# Patient Record
Sex: Female | Born: 1965 | Race: Black or African American | Hispanic: No | Marital: Single | Smoking: Never smoker
Health system: Southern US, Community
[De-identification: ages and names within clinical notes are randomized; demographics above are authoritative.]

## PROBLEM LIST (undated history)

## (undated) DIAGNOSIS — R7301 Impaired fasting glucose: Secondary | ICD-10-CM

## (undated) DIAGNOSIS — G4733 Obstructive sleep apnea (adult) (pediatric): Secondary | ICD-10-CM

## (undated) DIAGNOSIS — D569 Thalassemia, unspecified: Secondary | ICD-10-CM

## (undated) DIAGNOSIS — D509 Iron deficiency anemia, unspecified: Secondary | ICD-10-CM

## (undated) DIAGNOSIS — E669 Obesity, unspecified: Secondary | ICD-10-CM

## (undated) DIAGNOSIS — J309 Allergic rhinitis, unspecified: Secondary | ICD-10-CM

## (undated) DIAGNOSIS — K8020 Calculus of gallbladder without cholecystitis without obstruction: Secondary | ICD-10-CM

## (undated) DIAGNOSIS — I10 Essential (primary) hypertension: Secondary | ICD-10-CM

## (undated) DIAGNOSIS — E119 Type 2 diabetes mellitus without complications: Secondary | ICD-10-CM

## (undated) HISTORY — DX: Thalassemia, unspecified: D56.9

## (undated) HISTORY — DX: Calculus of gallbladder without cholecystitis without obstruction: K80.20

## (undated) HISTORY — DX: Allergic rhinitis, unspecified: J30.9

## (undated) HISTORY — DX: Obstructive sleep apnea (adult) (pediatric): G47.33

## (undated) HISTORY — DX: Essential (primary) hypertension: I10

## (undated) HISTORY — DX: Obesity, unspecified: E66.9

## (undated) HISTORY — DX: Type 2 diabetes mellitus without complications: E11.9

## (undated) HISTORY — DX: Impaired fasting glucose: R73.01

## (undated) HISTORY — DX: Iron deficiency anemia, unspecified: D50.9

---

## 1997-08-21 HISTORY — PX: CHOLECYSTECTOMY: SHX55

## 2000-10-25 ENCOUNTER — Encounter: Payer: Self-pay | Admitting: Internal Medicine

## 2000-10-25 ENCOUNTER — Ambulatory Visit (HOSPITAL_COMMUNITY): Admission: RE | Admit: 2000-10-25 | Discharge: 2000-10-25 | Payer: Self-pay | Admitting: Internal Medicine

## 2003-03-05 ENCOUNTER — Other Ambulatory Visit: Admission: RE | Admit: 2003-03-05 | Discharge: 2003-03-05 | Payer: Self-pay | Admitting: *Deleted

## 2004-01-23 ENCOUNTER — Encounter: Admission: RE | Admit: 2004-01-23 | Discharge: 2004-01-23 | Payer: Self-pay | Admitting: Internal Medicine

## 2005-12-28 ENCOUNTER — Encounter: Admission: RE | Admit: 2005-12-28 | Discharge: 2005-12-28 | Payer: Self-pay | Admitting: *Deleted

## 2006-07-26 ENCOUNTER — Emergency Department (HOSPITAL_COMMUNITY): Admission: EM | Admit: 2006-07-26 | Discharge: 2006-07-26 | Payer: Self-pay | Admitting: Emergency Medicine

## 2006-12-30 ENCOUNTER — Encounter: Admission: RE | Admit: 2006-12-30 | Discharge: 2006-12-30 | Payer: Self-pay | Admitting: *Deleted

## 2007-02-14 ENCOUNTER — Ambulatory Visit: Payer: Self-pay | Admitting: Gastroenterology

## 2007-02-28 ENCOUNTER — Ambulatory Visit: Payer: Self-pay | Admitting: Gastroenterology

## 2008-01-30 ENCOUNTER — Encounter: Admission: RE | Admit: 2008-01-30 | Discharge: 2008-01-30 | Payer: Self-pay | Admitting: Obstetrics and Gynecology

## 2008-08-09 ENCOUNTER — Encounter: Admission: RE | Admit: 2008-08-09 | Discharge: 2008-11-07 | Payer: Self-pay | Admitting: Internal Medicine

## 2008-12-03 ENCOUNTER — Encounter: Admission: RE | Admit: 2008-12-03 | Discharge: 2008-12-03 | Payer: Self-pay | Admitting: Internal Medicine

## 2009-02-06 ENCOUNTER — Encounter: Admission: RE | Admit: 2009-02-06 | Discharge: 2009-02-06 | Payer: Self-pay | Admitting: Obstetrics and Gynecology

## 2009-04-01 ENCOUNTER — Encounter: Admission: RE | Admit: 2009-04-01 | Discharge: 2009-06-30 | Payer: Self-pay | Admitting: Internal Medicine

## 2009-09-30 ENCOUNTER — Encounter: Admission: RE | Admit: 2009-09-30 | Discharge: 2009-09-30 | Payer: Self-pay | Admitting: Internal Medicine

## 2010-03-03 ENCOUNTER — Encounter
Admission: RE | Admit: 2010-03-03 | Discharge: 2010-03-03 | Payer: Self-pay | Source: Home / Self Care | Attending: Obstetrics and Gynecology | Admitting: Obstetrics and Gynecology

## 2012-02-15 ENCOUNTER — Encounter: Payer: Self-pay | Admitting: Gastroenterology

## 2012-10-04 ENCOUNTER — Other Ambulatory Visit: Payer: Self-pay

## 2012-10-04 DIAGNOSIS — Z1231 Encounter for screening mammogram for malignant neoplasm of breast: Secondary | ICD-10-CM

## 2012-10-18 ENCOUNTER — Encounter: Payer: Self-pay | Admitting: Gastroenterology

## 2012-10-26 ENCOUNTER — Ambulatory Visit
Admission: RE | Admit: 2012-10-26 | Discharge: 2012-10-26 | Disposition: A | Discharge status: Home / Self Care | Payer: PRIVATE HEALTH INSURANCE | Source: Ambulatory Visit

## 2012-10-26 DIAGNOSIS — Z1231 Encounter for screening mammogram for malignant neoplasm of breast: Secondary | ICD-10-CM

## 2013-11-02 ENCOUNTER — Other Ambulatory Visit: Payer: Self-pay

## 2013-11-02 DIAGNOSIS — Z1231 Encounter for screening mammogram for malignant neoplasm of breast: Secondary | ICD-10-CM

## 2013-11-13 ENCOUNTER — Ambulatory Visit: Payer: PRIVATE HEALTH INSURANCE

## 2013-12-05 ENCOUNTER — Encounter (INDEPENDENT_AMBULATORY_CARE_PROVIDER_SITE_OTHER): Payer: Self-pay

## 2013-12-05 ENCOUNTER — Ambulatory Visit
Admission: RE | Admit: 2013-12-05 | Discharge: 2013-12-05 | Disposition: A | Discharge status: Home / Self Care | Payer: 59 | Source: Ambulatory Visit

## 2013-12-05 DIAGNOSIS — Z1231 Encounter for screening mammogram for malignant neoplasm of breast: Secondary | ICD-10-CM

## 2014-12-20 ENCOUNTER — Other Ambulatory Visit: Payer: Self-pay

## 2014-12-20 DIAGNOSIS — Z1231 Encounter for screening mammogram for malignant neoplasm of breast: Secondary | ICD-10-CM

## 2014-12-31 ENCOUNTER — Ambulatory Visit
Admission: RE | Admit: 2014-12-31 | Discharge: 2014-12-31 | Disposition: A | Discharge status: Home / Self Care | Payer: 59 | Source: Ambulatory Visit

## 2014-12-31 DIAGNOSIS — Z1231 Encounter for screening mammogram for malignant neoplasm of breast: Secondary | ICD-10-CM

## 2015-12-19 ENCOUNTER — Other Ambulatory Visit (HOSPITAL_COMMUNITY): Payer: Self-pay | Admitting: Orthopedic Surgery

## 2015-12-19 ENCOUNTER — Ambulatory Visit (HOSPITAL_COMMUNITY): Payer: 59

## 2015-12-19 DIAGNOSIS — M7989 Other specified soft tissue disorders: Principal | ICD-10-CM

## 2015-12-19 DIAGNOSIS — M79661 Pain in right lower leg: Secondary | ICD-10-CM

## 2016-09-11 LAB — IFOBT (OCCULT BLOOD): IMMUNOLOGICAL FECAL OCCULT BLOOD TEST: POSITIVE

## 2016-09-14 ENCOUNTER — Encounter: Payer: Self-pay | Admitting: Gastroenterology

## 2016-10-26 ENCOUNTER — Other Ambulatory Visit: Payer: 59

## 2016-10-26 ENCOUNTER — Encounter: Payer: Self-pay | Admitting: Gastroenterology

## 2016-10-26 ENCOUNTER — Other Ambulatory Visit (INDEPENDENT_AMBULATORY_CARE_PROVIDER_SITE_OTHER): Payer: 59

## 2016-10-26 ENCOUNTER — Ambulatory Visit (INDEPENDENT_AMBULATORY_CARE_PROVIDER_SITE_OTHER): Payer: 59 | Admitting: Gastroenterology

## 2016-10-26 ENCOUNTER — Encounter (INDEPENDENT_AMBULATORY_CARE_PROVIDER_SITE_OTHER): Payer: Self-pay

## 2016-10-26 VITALS — BP 130/70 | HR 84 | Ht 63.0 in | Wt 394.0 lb

## 2016-10-26 DIAGNOSIS — G4733 Obstructive sleep apnea (adult) (pediatric): Secondary | ICD-10-CM

## 2016-10-26 DIAGNOSIS — Z800 Family history of malignant neoplasm of digestive organs: Secondary | ICD-10-CM | POA: Diagnosis not present

## 2016-10-26 DIAGNOSIS — R195 Other fecal abnormalities: Secondary | ICD-10-CM | POA: Diagnosis not present

## 2016-10-26 DIAGNOSIS — R718 Other abnormality of red blood cells: Secondary | ICD-10-CM

## 2016-10-26 DIAGNOSIS — D509 Iron deficiency anemia, unspecified: Secondary | ICD-10-CM | POA: Diagnosis not present

## 2016-10-26 LAB — CBC WITH DIFFERENTIAL/PLATELET
BASOS PCT: 0.5 % (ref 0.0–3.0)
Basophils Absolute: 0 10*3/uL (ref 0.0–0.1)
EOS ABS: 0.4 10*3/uL (ref 0.0–0.7)
Eosinophils Relative: 4.7 % (ref 0.0–5.0)
HCT: 32.1 % — ABNORMAL LOW (ref 36.0–46.0)
Hemoglobin: 9.9 g/dL — ABNORMAL LOW (ref 12.0–15.0)
LYMPHS ABS: 1.4 10*3/uL (ref 0.7–4.0)
Lymphocytes Relative: 15.2 % (ref 12.0–46.0)
MCHC: 30.9 g/dL (ref 30.0–36.0)
MCV: 59.3 fl — ABNORMAL LOW (ref 78.0–100.0)
MONO ABS: 0.4 10*3/uL (ref 0.1–1.0)
Monocytes Relative: 4.8 % (ref 3.0–12.0)
NEUTROS ABS: 6.8 10*3/uL (ref 1.4–7.7)
Neutrophils Relative %: 74.8 % (ref 43.0–77.0)
PLATELETS: 341 10*3/uL (ref 150.0–400.0)
RBC: 5.42 Mil/uL — ABNORMAL HIGH (ref 3.87–5.11)
RDW: 24.3 % — AB (ref 11.5–15.5)
WBC: 9 10*3/uL (ref 4.0–10.5)

## 2016-10-26 MED ORDER — NA SULFATE-K SULFATE-MG SULF 17.5-3.13-1.6 GM/177ML PO SOLN: 1.0000 | Freq: Once | ORAL | 0 refills | Status: AC

## 2016-10-26 NOTE — Progress Notes (Signed)
[facility] Consult Note:  History: Kerry Lopez 10/26/2016  Referring physician: Rodrigo Ran, MD  Reason for consult/chief complaint: Colon Cancer Screening (positive hemocult at Dr Perini's office, patient has noticed some spotting first week of July and lasted 3-4 days)   Subjective  HPI:  This is a 51 year old woman referred by primary care noted above for heme positive stool and microcytic anemia. Leyda last had a routine colonoscopy in December 2008. Her sister was diagnosed with colorectal cancer in her late 85s, died in her early to mid 31s. Patient was found to have heme positive stool a recent routine office visit, and labs reveal a microcytic anemia with a hemoglobin of 9.5 and an MCV of 55. She is known to have thalassemia, and says this has been a diagnosis for many years. I'm afraid I do not have any prior CBC for comparison. Iron studies had a saturation of 10%, and she admits that Dr.perini recommended an iron supplement but she did not take it more than a few days because she was feeling well. She has seen occasional blood staining on her underpants but no frank rectal bleeding. Her bowel habits are regular without abdominal pain, nausea, vomiting, early satiety, dysphagia, or unanticipated weight loss.   ROS:  Review of Systems  Constitutional: Negative for appetite change and unexpected weight change.  HENT: Negative for mouth sores and voice change.   Eyes: Negative for pain and redness.  Respiratory: Negative for cough and shortness of breath.   Cardiovascular: Negative for chest pain and palpitations.  Genitourinary: Negative for dysuria and hematuria.  Musculoskeletal: Negative for arthralgias and myalgias.  Skin: Negative for pallor and rash.  Neurological: Negative for weakness and headaches.  Hematological: Negative for adenopathy.     Past Medical History: Past Medical History:  Diagnosis Date  . Allergic rhinitis   . Diabetes  (HCC)   . Gallstones   . Hypertension   . IDA (iron deficiency anemia)   . IFG (impaired fasting glucose)   . Obesity   . Sleep apnea, obstructive   . Thalassemia    S-alpha thalassemia     Past Surgical History: Past Surgical History:  Procedure Laterality Date  . CHOLECYSTECTOMY  08/1997     Family History: Family History  Problem Relation Age of Onset  . Diabetes Mother   . Hypertension Mother   . Sickle cell trait Mother   . Colon polyps Mother   . Lung cancer Father        smoker  . Hypertension Sister   . Breast cancer Sister   . Colon cancer Sister 51  . Hypertension Sister   . Hypertension Sister   . Colon cancer Maternal Uncle   . Colon cancer Other        mat great grandmother  . Breast cancer Cousin        mat side    Social History: Social History   Social History  . Marital status: Single    Spouse name: N/A  . Number of children: 0  . Years of education: N/A   Occupational History  . operations accounts lead    Social History Main Topics  . Smoking status: Never Smoker  . Smokeless tobacco: Never Used  . Alcohol use No  . Drug use: No  . Sexual activity: Not Asked   Other Topics Concern  . None   Social History Narrative  . None    Allergies: No Known Allergies  Outpatient Meds:  Current Outpatient Prescriptions  Medication Sig Dispense Refill  . carvedilol (COREG) 12.5 MG tablet Take 12.5 mg by mouth 2 (two) times daily with a meal.    . hydrochlorothiazide (HYDRODIURIL) 25 MG tablet Take 25 mg by mouth daily.    Marland Kitchen losartan (COZAAR) 100 MG tablet Take 100 mg by mouth daily.    . Na Sulfate-K Sulfate-Mg Sulf 17.5-3.13-1.6 GM/180ML SOLN Take 1 kit by mouth once. 354 mL 0   No current facility-administered medications for this visit.       ___________________________________________________________________ Objective   Exam:  BP 130/70   Pulse 84   Ht '5\' 3"'$  (1.6 m)   Wt (!) 394 lb (178.7 kg)   BMI 69.79 kg/m     General: this is a(n) Well-appearing morbidly obese woman   Eyes: sclera anicteric, no redness  ENT: oral mucosa moist without lesions, no cervical or supraclavicular lymphadenopathy, good dentition  CV: RRR without murmur, S1/S2, no JVD, no peripheral edema  Resp: clear to auscultation bilaterally, normal RR and effort noted  GI: soft, no tenderness, with active bowel sounds. No guarding or palpable organomegaly noted, limited by BMI  Skin; warm and dry, no rash or jaundice noted  Neuro: awake, alert and oriented x 3. Normal gross motor function and fluent speech  Labs as noted above in the history, recent primary care noted and labs were reviewed.  Assessment: Encounter Diagnoses  Name Primary?  . Heme positive stool Yes  . Iron deficiency anemia, unspecified iron deficiency anemia type   . Family hx of colon cancer   . Morbid obesity ([place])   . Obstructive sleep apnea syndrome     Heme positive stool unclear relation to this microcytic anemia. She is known to have thalassemia, but a baseline hemoglobin is uncertain at this point. We discussed how she could have occult GI blood loss from upper or lower GI tract. As such, I feel she needs both EGD and colonoscopy. There is increased risk due to her BMI and sleep apnea , which requires that we perform the procedures in the hospital endoscopy lab.  Plan:  EGD and colonoscopy with anesthesia support Patient at increased risk for cardiopulmonary complications of procedure due to medical comorbidities.  CBC today  She was strongly encouraged to start oral iron therapy as recommended by primary care.  Thank you for the courtesy of this consult.  Please call me with any questions or concerns.  Nelida Meuse III  CC: Crist Infante, MD

## 2016-10-26 NOTE — Patient Instructions (Signed)
If you are age 51 or older, your body mass index should be between 23-30. Your Body mass index is 69.79 kg/m. If this is out of the aforementioned range listed, please consider follow up with your Primary Care Provider.  If you are age 51 or younger, your body mass index should be between 19-25. Your Body mass index is 69.79 kg/m. If this is out of the aformentioned range listed, please consider follow up with your Primary Care Provider.   You have been scheduled for an endoscopy and colonoscopy. Please follow the written instructions given to you at your visit today. Please pick up your prep supplies at the pharmacy within the next 1-3 days. If you use inhalers (even only as needed), please bring them with you on the day of your procedure. Your physician has requested that you go to www.startemmi.com and enter the access code given to you at your visit today. This web site gives a general overview about your procedure. However, you should still follow specific instructions given to you by our office regarding your preparation for the procedure.  Your physician has requested that you go to the basement for the following lab work before leaving today: CBC   Thank you for choosing [facility]   Dr Amada JupiterHenry Danis III

## 2016-10-27 LAB — DIFFERENTIAL
BASOS PCT: 0 %
Basophils Absolute: 0 cells/uL (ref 0–200)
Eosinophils Absolute: 360 cells/uL (ref 15–500)
Eosinophils Relative: 4 %
LYMPHS PCT: 16 %
Lymphs Abs: 1440 cells/uL (ref 850–3900)
MONOS PCT: 4 %
Monocytes Absolute: 360 cells/uL (ref 200–950)
NEUTROS ABS: 6840 {cells}/uL (ref 1500–7800)
Neutrophils Relative %: 76 %

## 2016-11-12 ENCOUNTER — Telehealth: Payer: Self-pay

## 2016-11-12 NOTE — Telephone Encounter (Signed)
Sherrilyn Rist, MD  Alexis Frock, CMA        Sheralyn Boatman,   Would you please see if this patient is willing to move up her EGD/colon from Fri, Oct 5th to Monday, Sept 10th?    Left message to return call.

## 2016-11-13 NOTE — Telephone Encounter (Signed)
Patient says that she does want to have her procedure moved up to Sept 10th. She is requesting a callback anytime after between 1:15pm-2pm. Best # [phone]

## 2016-11-13 NOTE — Telephone Encounter (Addendum)
Procedure has been moved from 12-25-2016 to 11-30-2016 at 815am. New instructions have been mailed to pts home address.

## 2016-11-17 ENCOUNTER — Encounter (HOSPITAL_COMMUNITY): Payer: Self-pay

## 2016-11-30 ENCOUNTER — Encounter (HOSPITAL_COMMUNITY): Payer: Self-pay

## 2016-11-30 ENCOUNTER — Ambulatory Visit (HOSPITAL_COMMUNITY)
Admission: RE | Admit: 2016-11-30 | Discharge: 2016-11-30 | Disposition: A | Discharge status: Home / Self Care | Payer: 59 | Source: Ambulatory Visit | Attending: Gastroenterology | Admitting: Gastroenterology

## 2016-11-30 ENCOUNTER — Ambulatory Visit (HOSPITAL_COMMUNITY): Payer: 59 | Admitting: Certified Registered Nurse Anesthetist

## 2016-11-30 ENCOUNTER — Encounter (HOSPITAL_COMMUNITY)
Admission: RE | Disposition: A | Discharge status: Home / Self Care | Payer: Self-pay | Source: Ambulatory Visit | Attending: Gastroenterology

## 2016-11-30 DIAGNOSIS — G4733 Obstructive sleep apnea (adult) (pediatric): Secondary | ICD-10-CM | POA: Diagnosis not present

## 2016-11-30 DIAGNOSIS — K921 Melena: Secondary | ICD-10-CM | POA: Diagnosis not present

## 2016-11-30 DIAGNOSIS — K449 Diaphragmatic hernia without obstruction or gangrene: Secondary | ICD-10-CM | POA: Insufficient documentation

## 2016-11-30 DIAGNOSIS — K3189 Other diseases of stomach and duodenum: Secondary | ICD-10-CM | POA: Insufficient documentation

## 2016-11-30 DIAGNOSIS — R195 Other fecal abnormalities: Secondary | ICD-10-CM | POA: Diagnosis not present

## 2016-11-30 DIAGNOSIS — Z800 Family history of malignant neoplasm of digestive organs: Secondary | ICD-10-CM | POA: Diagnosis not present

## 2016-11-30 DIAGNOSIS — D509 Iron deficiency anemia, unspecified: Secondary | ICD-10-CM | POA: Diagnosis not present

## 2016-11-30 DIAGNOSIS — E119 Type 2 diabetes mellitus without complications: Secondary | ICD-10-CM | POA: Insufficient documentation

## 2016-11-30 DIAGNOSIS — I10 Essential (primary) hypertension: Secondary | ICD-10-CM | POA: Insufficient documentation

## 2016-11-30 HISTORY — PX: ESOPHAGOGASTRODUODENOSCOPY (EGD) WITH PROPOFOL: SHX5813

## 2016-11-30 HISTORY — PX: COLONOSCOPY WITH PROPOFOL: SHX5780

## 2016-11-30 SURGERY — ESOPHAGOGASTRODUODENOSCOPY (EGD) WITH PROPOFOL
Anesthesia: Monitor Anesthesia Care

## 2016-11-30 MED ORDER — PROPOFOL 500 MG/50ML IV EMUL
INTRAVENOUS | Status: DC | PRN
  Administered 2016-11-30: 140 ug/kg/min via INTRAVENOUS

## 2016-11-30 MED ORDER — LIDOCAINE 2% (20 MG/ML) 5 ML SYRINGE
INTRAMUSCULAR | Status: DC | PRN
  Administered 2016-11-30: 100 mg via INTRAVENOUS

## 2016-11-30 MED ORDER — LACTATED RINGERS IV SOLN
INTRAVENOUS | Status: DC
Start: 2016-11-30 — End: 2016-11-30
  Administered 2016-11-30: 08:00:00 via INTRAVENOUS

## 2016-11-30 MED ORDER — LIDOCAINE 2% (20 MG/ML) 5 ML SYRINGE
INTRAMUSCULAR | Status: AC
  Filled 2016-11-30: qty 5

## 2016-11-30 MED ORDER — PROPOFOL 10 MG/ML IV BOLUS
INTRAVENOUS | Status: AC
  Filled 2016-11-30: qty 40

## 2016-11-30 MED ORDER — PROPOFOL 10 MG/ML IV BOLUS
INTRAVENOUS | Status: AC
  Filled 2016-11-30: qty 20

## 2016-11-30 MED ORDER — SODIUM CHLORIDE 0.9 % IV SOLN: INTRAVENOUS | Status: DC

## 2016-11-30 SURGICAL SUPPLY — 25 items

## 2016-11-30 NOTE — H&P (Signed)
History:  This patient presents for endoscopic testing for heme pos stool and iron deficiency anemia.  Charolotte CapuchinFaith C Chapel Referring physician: Rodrigo RanPerini, Mark, MD  Past Medical History: Past Medical History:  Diagnosis Date  . Allergic rhinitis   . Diabetes (HCC)    managing with diet  . Gallstones   . Hypertension   . IDA (iron deficiency anemia)   . IFG (impaired fasting glucose)   . Obesity   . Sleep apnea, obstructive   . Thalassemia    S-alpha thalassemia     Past Surgical History: Past Surgical History:  Procedure Laterality Date  . CHOLECYSTECTOMY  08/1997    Allergies: No Known Allergies  Outpatient Meds: Current Facility-Administered Medications  Medication Dose Route Frequency Provider Last Rate Last Dose  . 0.9 %  sodium chloride infusion   Intravenous Continuous Danis, Starr LakeHenry L III, MD      . lactated ringers infusion   Intravenous Continuous Danis, Starr LakeHenry L III, MD          ___________________________________________________________________ Objective   Exam:  BP (!) 189/68   Pulse 78   Temp 98.7 F (37.1 C) (Oral)   Resp 17   Ht 5\' 3"  (1.6 m)   Wt (!) 394 lb (178.7 kg)   SpO2 97%   BMI 69.79 kg/m    CV: RRR without murmur, S1/S2, no JVD, no peripheral edema  Resp: clear to auscultation bilaterally, normal RR and effort noted  WU:[id number]:morbidly obese, soft, no tenderness, with active bowel sounds. No guarding or palpable organomegaly noted.  Neuro: awake, alert and oriented x 3. Normal gross motor function and fluent speech   Assessment:  IDA, heme pos  Plan:  EGD/colonoscopy   Charlie PitterHenry L Danis III

## 2016-11-30 NOTE — Interval H&P Note (Signed)
History and Physical Interval Note:  11/30/2016 8:21 AM  Kerry Lopez  has presented today for surgery, with the diagnosis of Heme+ stool, IDA tt  The various methods of treatment have been discussed with the patient and family. After consideration of risks, benefits and other options for treatment, the patient has consented to  Procedure(s): ESOPHAGOGASTRODUODENOSCOPY (EGD) WITH PROPOFOL (N/A) COLONOSCOPY WITH PROPOFOL (N/A) as a surgical intervention .  The patient's history has been reviewed, patient examined, no change in status, stable for surgery.  I have reviewed the patient's chart and labs.  Questions were answered to the patient's satisfaction.     Kerry Lopez

## 2016-11-30 NOTE — Op Note (Signed)
[facility] Patient Name: Kerry Lopez Procedure Date: 11/30/2016 MRN: [id number] Attending MD: Estill Cotta. Loletha Carrow , MD Date of Birth: 1965-08-20 CSN: [id number] Age: 51 Admit Type: Outpatient Procedure:                Upper GI endoscopy Indications:              Iron deficiency anemia, Heme positive stool Providers:                Mallie Mussel L. Loletha Carrow, MD, Angus Seller, William Dalton, Technician Referring MD:             Crist Infante, MD Medicines:                Monitored Anesthesia Care (BMI 69) Complications:            No immediate complications. Estimated Blood Loss:     Estimated blood loss: none. Procedure:                Pre-Anesthesia Assessment:                           - Prior to the procedure, a History and Physical                            was performed, and patient medications and                            allergies were reviewed. The patient's tolerance of                            previous anesthesia was also reviewed. The risks                            and benefits of the procedure and the sedation                            options and risks were discussed with the patient.                            All questions were answered, and informed consent                            was obtained. Prior Anticoagulants: The patient has                            taken no previous anticoagulant or antiplatelet                            agents. ASA Grade Assessment: III - A patient with                            severe systemic disease. After reviewing the risks  and benefits, the patient was deemed in                            satisfactory condition to undergo the procedure.                           After obtaining informed consent, the endoscope was                            passed under direct vision. Throughout the                            procedure, the patient's blood pressure, pulse, and                         oxygen saturations were monitored continuously. The                            EG-[id number] ([id number]) scope was introduced through the                            mouth, and advanced to the second part of duodenum.                            The upper GI endoscopy was accomplished without                            difficulty. The patient tolerated the procedure. Scope In: Scope Out: Findings:      A medium-sized hiatal hernia was present.      The stomach was normal.      The cardia and gastric fundus were normal on retroflexion.      A single 10 mm submucosal nodule was found in the second portion of the       duodenum, visibile but past the reach of the endoscope. Impression:               - Medium-sized hiatal hernia.                           - Normal stomach.                           - Submucosal nodule found in the duodenum.                           - No specimens collected. Moderate Sedation:      MAC sedation used Recommendation:           - Patient has a contact number available for                            emergencies. The signs and symptoms of potential                            delayed complications were discussed with the  patient. Return to normal activities tomorrow.                            Written discharge instructions were provided to the                            patient.                           - Resume previous diet.                           - Continue present medications.                           - See the other procedure note for documentation of                            additional recommendations.                           - Will investigate feasibility of EUS for duodenal                            finding. Procedure Code(s):        --- Professional ---                           [phone], Esophagogastroduodenoscopy, flexible,                            transoral; diagnostic, including collection of                             specimen(s) by brushing or washing, when performed                            (separate procedure) Diagnosis Code(s):        --- Professional ---                           K44.9, Diaphragmatic hernia without obstruction or                            gangrene                           K31.89, Other diseases of stomach and duodenum                           D50.9, Iron deficiency anemia, unspecified                           R19.5, Other fecal abnormalities CPT copyright 2016 [employer]. All rights reserved. The codes documented in this report are preliminary and upon coder review may  be revised to meet current compliance requirements. Kayden Hutmacher L. Loletha Carrow, MD 11/30/2016 9:12:11 AM This report has been signed electronically.  Number of Addenda: 0 

## 2016-11-30 NOTE — Discharge Instructions (Signed)
YOU HAD AN ENDOSCOPIC PROCEDURE TODAY: Refer to the procedure report and other information in the discharge instructions given to you for any specific questions about what was found during the examination. If this information does not answer your questions, please call [place] office at [phone] to clarify.  ° °YOU SHOULD EXPECT: Some feelings of bloating in the abdomen. Passage of more gas than usual. Walking can help get rid of the air that was put into your GI tract during the procedure and reduce the bloating. If you had a lower endoscopy (such as a colonoscopy or flexible sigmoidoscopy) you may notice spotting of blood in your stool or on the toilet paper. Some abdominal soreness may be present for a day or two, also. ° °DIET: Your first meal following the procedure should be a light meal and then it is ok to progress to your normal diet. A half-sandwich or bowl of soup is an example of a good first meal. Heavy or fried foods are harder to digest and may make you feel nauseous or bloated. Drink plenty of fluids but you should avoid alcoholic beverages for 24 hours. If you had a esophageal dilation, please see attached instructions for diet.   ° °ACTIVITY: Your care partner should take you home directly after the procedure. You should plan to take it easy, moving slowly for the rest of the day. You can resume normal activity the day after the procedure however YOU SHOULD NOT DRIVE, use power tools, machinery or perform tasks that involve climbing or major physical exertion for 24 hours (because of the sedation medicines used during the test).  ° °SYMPTOMS TO REPORT IMMEDIATELY: °A gastroenterologist can be reached at any hour. Please call [phone]  for any of the following symptoms:  °Following lower endoscopy (colonoscopy, flexible sigmoidoscopy) °Excessive amounts of blood in the stool  °Significant tenderness, worsening of abdominal pains  °Swelling of the abdomen that is new, acute  °Fever of 100° or  higher  °Following upper endoscopy (EGD, EUS, ERCP, esophageal dilation) °Vomiting of blood or coffee ground material  °New, significant abdominal pain  °New, significant chest pain or pain under the shoulder blades  °Painful or persistently difficult swallowing  °New shortness of breath  °Black, tarry-looking or red, bloody stools ° °FOLLOW UP:  °If any biopsies were taken you will be contacted by phone or by letter within the next 1-3 weeks. Call [phone]  if you have not heard about the biopsies in 3 weeks.  °Please also call with any specific questions about appointments or follow up tests. ° °

## 2016-11-30 NOTE — Anesthesia Postprocedure Evaluation (Signed)
Anesthesia Post Note  Patient: Kerry Lopez  Procedure(s) Performed: Procedure(s) (LRB): ESOPHAGOGASTRODUODENOSCOPY (EGD) WITH PROPOFOL (N/A) COLONOSCOPY WITH PROPOFOL (N/A)     Patient location during evaluation: PACU Anesthesia Type: MAC Level of consciousness: awake and alert Pain management: pain level controlled Vital Signs Assessment: post-procedure vital signs reviewed and stable Respiratory status: spontaneous breathing, nonlabored ventilation, respiratory function stable and patient connected to nasal cannula oxygen Cardiovascular status: stable and blood pressure returned to baseline Anesthetic complications: no    Last Vitals:  Vitals:   11/30/16 0920 11/30/16 0930  BP: (!) 119/57 138/61  Pulse: 85 81  Resp: 19 20  Temp:    SpO2: 100% 99%    Last Pain:  Vitals:   11/30/16 0910  TempSrc: Oral                 Ryan P Ellender

## 2016-11-30 NOTE — Anesthesia Preprocedure Evaluation (Addendum)
Anesthesia Evaluation  Patient identified by MRN, date of birth, ID band Patient awake    Reviewed: Allergy & Precautions, NPO status , Patient's Chart, lab work & pertinent test results  Airway Mallampati: II  TM Distance: >3 FB Neck ROM: Full    Dental no notable dental hx.    Pulmonary sleep apnea ,    Pulmonary exam normal breath sounds clear to auscultation       Cardiovascular hypertension, Pt. on medications and Pt. on home beta blockers Normal cardiovascular exam Rhythm:Regular Rate:Normal     Neuro/Psych negative neurological ROS  negative psych ROS   GI/Hepatic negative GI ROS, Neg liver ROS,   Endo/Other  diabetesMorbid obesity  Renal/GU negative Renal ROS     Musculoskeletal negative musculoskeletal ROS (+)   Abdominal (+) + obese,   Peds  Hematology  (+) anemia ,   Anesthesia Other Findings Super obese  Reproductive/Obstetrics                           Anesthesia Physical Anesthesia Plan  ASA: IV  Anesthesia Plan: MAC   Post-op Pain Management:    Induction: Intravenous  PONV Risk Score and Plan: 2 and Propofol infusion and Treatment may vary due to age or medical condition  Airway Management Planned: Natural Airway  Additional Equipment:   Intra-op Plan:   Post-operative Plan:   Informed Consent: I have reviewed the patients History and Physical, chart, labs and discussed the procedure including the risks, benefits and alternatives for the proposed anesthesia with the patient or authorized representative who has indicated his/her understanding and acceptance.   Dental advisory given  Plan Discussed with: CRNA  Anesthesia Plan Comments:         Anesthesia Quick Evaluation

## 2016-11-30 NOTE — Op Note (Signed)
[facility] Patient Name: Kerry Lopez Procedure Date: 11/30/2016 MRN: [id number] Attending MD: Estill Cotta. Loletha Carrow , MD Date of Birth: June 06, 1965 CSN: [id number] Age: 51 Admit Type: Outpatient Procedure:                Colonoscopy Indications:              Heme positive stool, Iron deficiency anemia Providers:                Mallie Mussel L. Loletha Carrow, MD, Angus Seller, William Dalton, Technician Referring MD:             Crist Infante, MD Medicines:                Monitored Anesthesia Care Complications:            No immediate complications. Estimated Blood Loss:     Estimated blood loss: none. Procedure:                Pre-Anesthesia Assessment:                           - Prior to the procedure, a History and Physical                            was performed, and patient medications and                            allergies were reviewed. The patient's tolerance of                            previous anesthesia was also reviewed. The risks                            and benefits of the procedure and the sedation                            options and risks were discussed with the patient.                            All questions were answered, and informed consent                            was obtained. Prior Anticoagulants: The patient has                            taken no previous anticoagulant or antiplatelet                            agents. ASA Grade Assessment: III - A patient with                            severe systemic disease. After reviewing the risks  and benefits, the patient was deemed in                            satisfactory condition to undergo the procedure.                           - Prior to the procedure, a History and Physical                            was performed, and patient medications and                            allergies were reviewed. The patient's tolerance of      previous anesthesia was also reviewed. The risks                            and benefits of the procedure and the sedation                            options and risks were discussed with the patient.                            All questions were answered, and informed consent                            was obtained. Prior Anticoagulants: The patient has                            taken no previous anticoagulant or antiplatelet                            agents. ASA Grade Assessment: III - A patient with                            severe systemic disease. After reviewing the risks                            and benefits, the patient was deemed in                            satisfactory condition to undergo the procedure.                           After obtaining informed consent, the colonoscope                            was passed under direct vision. Throughout the                            procedure, the patient's blood pressure, pulse, and                            oxygen saturations were monitored continuously. The  EC-[id number] ([id number]) scope was introduced through                            the anus and advanced to the the cecum, identified                            by appendiceal orifice and ileocecal valve. The                            colonoscopy was performed without difficulty. The                            patient tolerated the procedure. The quality of the                            bowel preparation was evaluated using the BBPS                            [facility] Bowel Preparation Scale) with scores of:                            Right Colon = 2, Transverse Colon = 2 and Left                            Colon = 2. The total BBPS score equals 6. The bowel                            preparation used was SUPREP. The ileocecal valve,                            appendiceal orifice, and rectum were photographed. Scope In: 8:49:59 AM Scope Out:  9:02:59 AM Scope Withdrawal Time: 0 hours 7 minutes 25 seconds  Total Procedure Duration: 0 hours 13 minutes 0 seconds  Findings:      The perianal and digital rectal examinations were normal.      The entire examined colon appeared normal on direct and retroflexion       views. Impression:               - The entire examined colon is normal on direct and                            retroflexion views.                           - No specimens collected.                           No source of heme positive stool or anemia on                            either EGD or colonoscopy. The patient's microcytic                            anemia  is at least partially due to reported                            thalasemia. Moderate Sedation:      MAC sedation used Recommendation:           - Patient has a contact number available for                            emergencies. The signs and symptoms of potential                            delayed complications were discussed with the                            patient. Return to normal activities tomorrow.                            Written discharge instructions were provided to the                            patient.                           - Resume previous diet.                           - Continue present medications.                           - Repeat colonoscopy in 5 years for screening                            purposes due to family history of colon cancer in                            sister (Dx in 68's).                           - Return to referring physician to mange iron                            replacement and anemia. Procedure Code(s):        --- Professional ---                           [phone], Colonoscopy, flexible; diagnostic, including                            collection of specimen(s) by brushing or washing,                            when performed (separate procedure) Diagnosis Code(s):        --- Professional ---                            R19.5, Other  fecal abnormalities                           D50.9, Iron deficiency anemia, unspecified CPT copyright 2016 American Medical Association. All rights reserved. The codes documented in this report are preliminary and upon coder review may  be revised to meet current compliance requirements. Henry L. Loletha Carrow, MD 11/30/2016 9:16:19 AM This report has been signed electronically. Number of Addenda: 0

## 2016-11-30 NOTE — Transfer of Care (Signed)
Immediate Anesthesia Transfer of Care Note  Patient: Kerry Lopez  Procedure(s) Performed: Procedure(s): ESOPHAGOGASTRODUODENOSCOPY (EGD) WITH PROPOFOL (N/A) COLONOSCOPY WITH PROPOFOL (N/A)  Patient Location: PACU and Endoscopy Unit  Anesthesia Type:MAC  Level of Consciousness: awake, alert , oriented and patient cooperative  Airway & Oxygen Therapy: Patient Spontanous Breathing and Patient connected to face mask oxygen  Post-op Assessment: Report given to RN, Post -op Vital signs reviewed and stable and Patient moving all extremities  Post vital signs: Reviewed and stable  Last Vitals:  Vitals:   11/30/16 0655  BP: (!) 189/68  Pulse: 78  Resp: 17  Temp: 37.1 C  SpO2: 97%    Last Pain:  Vitals:   11/30/16 0655  TempSrc: Oral         Complications: No apparent anesthesia complications

## 2016-12-01 ENCOUNTER — Encounter (HOSPITAL_COMMUNITY): Payer: Self-pay | Admitting: Gastroenterology

## 2017-05-26 ENCOUNTER — Encounter (HOSPITAL_COMMUNITY): Payer: 59

## 2020-07-24 ENCOUNTER — Other Ambulatory Visit (HOSPITAL_COMMUNITY): Payer: Self-pay

## 2020-07-25 ENCOUNTER — Encounter (HOSPITAL_COMMUNITY)
Admission: RE | Admit: 2020-07-25 | Discharge: 2020-07-25 | Disposition: A | Discharge status: Home / Self Care | Payer: 59 | Source: Ambulatory Visit | Attending: Internal Medicine | Admitting: Internal Medicine

## 2020-07-25 ENCOUNTER — Other Ambulatory Visit: Payer: Self-pay

## 2020-07-25 DIAGNOSIS — D649 Anemia, unspecified: Secondary | ICD-10-CM | POA: Insufficient documentation

## 2020-07-25 MED ORDER — FERUMOXYTOL INJECTION 510 MG/17 ML
510.0000 mg | INTRAVENOUS | Status: DC
  Administered 2020-07-25: 510 mg via INTRAVENOUS
  Filled 2020-07-25: qty 17

## 2020-07-25 NOTE — Discharge Instructions (Signed)
Ferumoxytol injection What is this medicine? FERUMOXYTOL is an iron complex. Iron is used to make healthy red blood cells, which carry oxygen and nutrients throughout the body. This medicine is used to treat iron deficiency anemia. This medicine may be used for other purposes; ask your health care provider or pharmacist if you have questions. COMMON BRAND NAME(S): Feraheme What should I tell my health care provider before I take this medicine? They need to know if you have any of these conditions:  anemia not caused by low iron levels  high levels of iron in the blood  magnetic resonance imaging (MRI) test scheduled  an unusual or allergic reaction to iron, other medicines, foods, dyes, or preservatives  pregnant or trying to get pregnant  breast-feeding How should I use this medicine? This medicine is for injection into a vein. It is given by a health care professional in a hospital or clinic setting. Talk to your pediatrician regarding the use of this medicine in children. Special care may be needed. Overdosage: If you think you have taken too much of this medicine contact a poison control center or emergency room at once. NOTE: This medicine is only for you. Do not share this medicine with others. What if I miss a dose? It is important not to miss your dose. Call your doctor or health care professional if you are unable to keep an appointment. What may interact with this medicine? This medicine may interact with the following medications:  other iron products This list may not describe all possible interactions. Give your health care provider a list of all the medicines, herbs, non-prescription drugs, or dietary supplements you use. Also tell them if you smoke, drink alcohol, or use illegal drugs. Some items may interact with your medicine. What should I watch for while using this medicine? Visit your doctor or healthcare professional regularly. Tell your doctor or healthcare  professional if your symptoms do not start to get better or if they get worse. You may need blood work done while you are taking this medicine. You may need to follow a special diet. Talk to your doctor. Foods that contain iron include: whole grains/cereals, dried fruits, beans, or peas, leafy green vegetables, and organ meats (liver, kidney). What side effects may I notice from receiving this medicine? Side effects that you should report to your doctor or health care professional as soon as possible:  allergic reactions like skin rash, itching or hives, swelling of the face, lips, or tongue  breathing problems  changes in blood pressure  feeling faint or lightheaded, falls  fever or chills  flushing, sweating, or hot feelings  swelling of the ankles or feet Side effects that usually do not require medical attention (report to your doctor or health care professional if they continue or are bothersome):  diarrhea  headache  nausea, vomiting  stomach pain This list may not describe all possible side effects. Call your doctor for medical advice about side effects. You may report side effects to FDA at 1-800-FDA-1088. Where should I keep my medicine? This drug is given in a hospital or clinic and will not be stored at home. NOTE: This sheet is a summary. It may not cover all possible information. If you have questions about this medicine, talk to your doctor, pharmacist, or health care provider.  2021 Elsevier/Gold Standard (2016-04-27 20:21:10)  

## 2020-08-01 ENCOUNTER — Encounter (HOSPITAL_COMMUNITY)
Admission: RE | Admit: 2020-08-01 | Discharge: 2020-08-01 | Disposition: A | Discharge status: Home / Self Care | Payer: 59 | Source: Ambulatory Visit | Attending: Internal Medicine | Admitting: Internal Medicine

## 2020-08-01 ENCOUNTER — Other Ambulatory Visit: Payer: Self-pay

## 2020-08-01 DIAGNOSIS — D649 Anemia, unspecified: Secondary | ICD-10-CM | POA: Diagnosis not present

## 2020-08-01 MED ORDER — FERUMOXYTOL INJECTION 510 MG/17 ML
510.0000 mg | INTRAVENOUS | Status: AC
  Administered 2020-08-01: 510 mg via INTRAVENOUS
  Filled 2020-08-01: qty 510

## 2021-02-11 ENCOUNTER — Other Ambulatory Visit: Payer: Self-pay | Admitting: Internal Medicine

## 2021-02-11 DIAGNOSIS — E785 Hyperlipidemia, unspecified: Secondary | ICD-10-CM

## 2021-03-25 ENCOUNTER — Ambulatory Visit
Admission: RE | Admit: 2021-03-25 | Discharge: 2021-03-25 | Disposition: A | Discharge status: Home / Self Care | Payer: No Typology Code available for payment source | Source: Ambulatory Visit | Attending: Internal Medicine | Admitting: Internal Medicine

## 2021-03-25 DIAGNOSIS — E785 Hyperlipidemia, unspecified: Secondary | ICD-10-CM

## 2022-01-08 ENCOUNTER — Encounter: Payer: Self-pay | Admitting: Gastroenterology

## 2022-12-16 ENCOUNTER — Other Ambulatory Visit (HOSPITAL_COMMUNITY): Payer: Self-pay | Admitting: Internal Medicine

## 2022-12-16 DIAGNOSIS — R430 Anosmia: Secondary | ICD-10-CM

## 2022-12-23 ENCOUNTER — Ambulatory Visit (HOSPITAL_COMMUNITY)
Admission: RE | Admit: 2022-12-23 | Discharge: 2022-12-23 | Disposition: A | Discharge status: Home / Self Care | Payer: Commercial Managed Care - PPO | Source: Ambulatory Visit | Attending: Internal Medicine | Admitting: Internal Medicine

## 2022-12-23 DIAGNOSIS — R430 Anosmia: Secondary | ICD-10-CM | POA: Insufficient documentation

## 2022-12-23 MED ORDER — GADOBUTROL 1 MMOL/ML IV SOLN
10.0000 mL | Freq: Once | INTRAVENOUS | Status: AC | PRN
  Administered 2022-12-23: 10 mL via INTRAVENOUS

## 2022-12-23 NOTE — Progress Notes (Signed)
  Called to MRI for contrast reaction.  Patient underwent MRI brain today. She did have episode of vomiting after.  She also noticed a small bump on the flexor surface of her left arm.  On exam, it actually looks like an insect bite. The IV was in the right [facility].      She has no other bumps or rash anywhere else.   She denies SOB, sensation of her throat swelling nor her tongue swelling.   She is here alone and drove herself. She feels ok.  I recommended Benadryl 25 mg PO when she gets home and Hydrocortizone cream or Benadryl cream to the area.  She understands if she should develop SOB or feeling of her tongue/throat swelling to call 911.  Latriece Anstine S Jerlyn Pain PA-C 12/23/2022 2:56 PM

## 2023-07-21 IMAGING — CT CT CARDIAC CORONARY ARTERY CALCIUM SCORE
2 of 6 series · 4 of 20 positions shown, 5 images · non-contrast
Comparison: None.

CLINICAL DATA: 55-year-old African American female with history of
hyperlipidemia, hypertension and diabetes.

EXAM:
CT CARDIAC CORONARY ARTERY CALCIUM SCORE
TECHNIQUE: Non-contrast imaging through the heart was performed using
prospective ECG gating. Image post processing was performed on an
independent workstation, allowing for quantitative analysis of the
heart and coronary arteries. Note that this exam targets the heart
and the chest was not imaged in its entirety.

[Series 2: calcium scoring 2.00 qr36 bestdiast 71% hrt calciu · axial · 0.43mm/px · z∈[+1634,+1680]mm · 2 of 70 slices shown, 3 images]
[im 24/70  vessel]
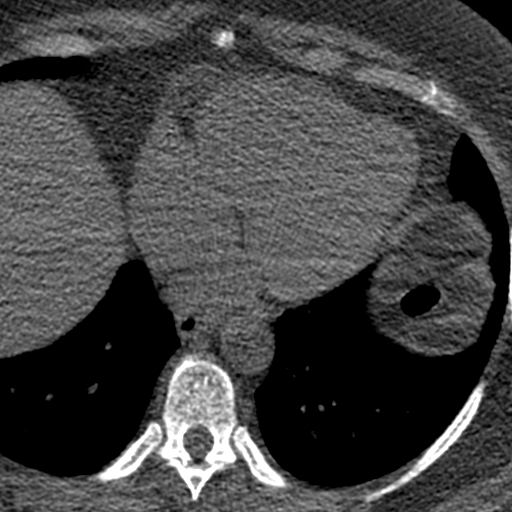
[im 24/70  lung]
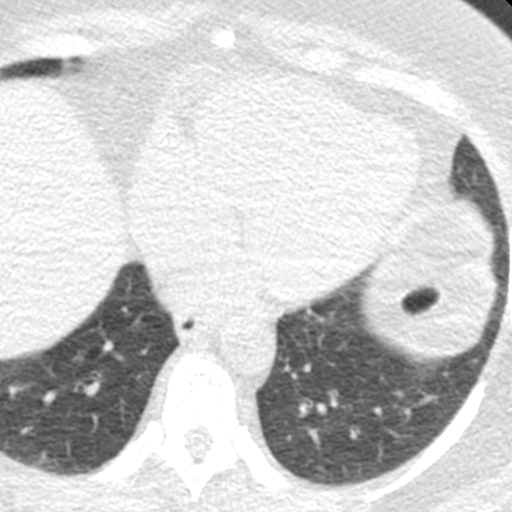
[im 47/70  vessel]
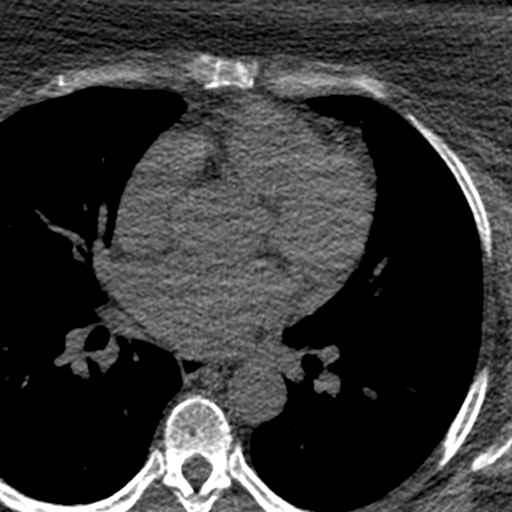

[Series 3: calcium scoring 2.00 br40 bestdiast 71% axial · axial · 0.73mm/px · z∈[+1634,+1680]mm · 2 of 70 slices shown]
[im 24/70  vessel]
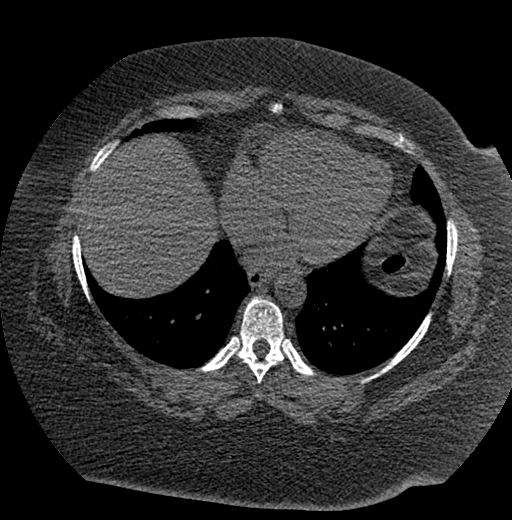
[im 47/70  vessel]
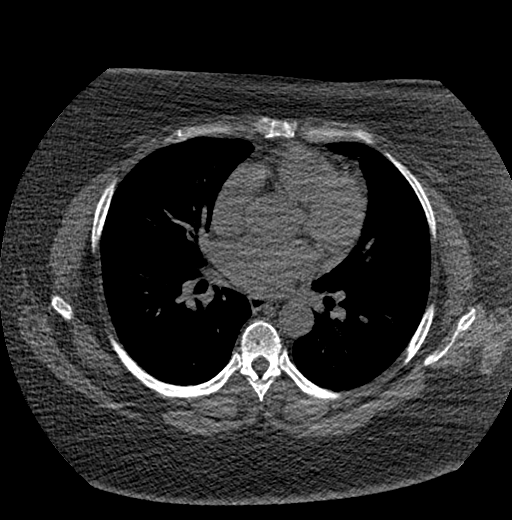

[4 of 20 positions shown; findings below may reference images not displayed]

FINDINGS: CORONARY CALCIUM SCORES:

Left Main: 0

LAD: 0

LCx: 0

RCA:

Total Agatston Score:

[HOSPITAL] percentile: 90

AORTA MEASUREMENTS:

Ascending Aorta: 35 mm

Descending Aorta: 26 mm

OTHER FINDINGS:

The study is significantly limited due to motion artifact and
primarily body habitus. Repeating scanning a second time did not
improve quality. The heart size is within normal limits. No
pericardial fluid is identified. Visualized segments of the thoracic
aorta and central pulmonary arteries are normal in caliber.
Visualized mediastinum and hilar regions demonstrate no
lymphadenopathy or masses. Probable small hiatal hernia. Visualized
lungs show no evidence of pulmonary edema, consolidation,
pneumothorax, nodule or pleural fluid. Visualized upper abdomen and
bony structures are unremarkable.
IMPRESSION: 1. Limited study due to body habitus and motion artifact.
2. Coronary calcium score of 31.8 is at the 90th percentile for the
patient's age, sex and race.
3. Probable small hiatal hernia.

## 2024-01-04 ENCOUNTER — Encounter: Payer: Self-pay | Admitting: Gastroenterology

## 2024-01-04 ENCOUNTER — Ambulatory Visit: Admitting: Gastroenterology

## 2024-01-04 VITALS — BP 138/80 | HR 72 | Ht 61.5 in | Wt 367.1 lb

## 2024-01-04 DIAGNOSIS — Z800 Family history of malignant neoplasm of digestive organs: Secondary | ICD-10-CM | POA: Diagnosis not present

## 2024-01-04 DIAGNOSIS — K3189 Other diseases of stomach and duodenum: Secondary | ICD-10-CM

## 2024-01-04 MED ORDER — NA SULFATE-K SULFATE-MG SULF 17.5-3.13-1.6 GM/177ML PO SOLN: 1.0000 | Freq: Once | ORAL | 0 refills | Status: AC

## 2024-01-04 NOTE — Patient Instructions (Signed)
 You have been scheduled for an endoscopy and colonoscopy. Please follow the written instructions given to you at your visit today.  If you use inhalers (even only as needed), please bring them with you on the day of your procedure.  DO NOT TAKE 7 DAYS PRIOR TO TEST- Trulicity (dulaglutide) Ozempic, Wegovy (semaglutide) Mounjaro (tirzepatide) Bydureon Bcise (exanatide extended release)  DO NOT TAKE 1 DAY PRIOR TO YOUR TEST Rybelsus (semaglutide) Adlyxin (lixisenatide) Victoza (liraglutide) Byetta (exanatide) ___________________________________________________________________________   _______________________________________________________  If your blood pressure at your visit was 140/90 or greater, please contact your primary care physician to follow up on this.  _______________________________________________________  If you are age 80 or older, your body mass index should be between 23-30. Your Body mass index is 68.24 kg/m. If this is out of the aforementioned range listed, please consider follow up with your Primary Care Provider.  If you are age 66 or younger, your body mass index should be between 19-25. Your Body mass index is 68.24 kg/m. If this is out of the aformentioned range listed, please consider follow up with your Primary Care Provider.   ________________________________________________________  The [facility] providers would like to encourage you to use MYCHART to communicate with providers for non-urgent requests or questions.  Due to long hold times on the telephone, sending your provider a message by [facility] may be a faster and more efficient way to get a response.  Please allow 48 business hours for a response.  Please remember that this is for non-urgent requests.  _______________________________________________________  [facility] is using a team-based approach to care.  Your team is made up of your doctor and two to three APPS. Our APPS (Nurse  Practitioners and Physician Assistants) work with your physician to ensure care continuity for you. They are fully qualified to address your health concerns and develop a treatment plan. They communicate directly with your gastroenterologist to care for you. Seeing the Advanced Practice Practitioners on your physician's team can help you by facilitating care more promptly, often allowing for earlier appointments, access to diagnostic testing, procedures, and other specialty referrals.    Thank you for trusting me with your gastrointestinal care!    Dr. Victory Legrand DOUGLAS [facility]

## 2024-01-04 NOTE — Progress Notes (Signed)
 Kerry Lopez:  History: Kerry Lopez 01/04/2024  Referring provider: Shayne Anes, MD  Reason for consult/chief complaint: Colon Cancer Screening (Patient is here today to discuss scheduling a colon screening. No other complaints at this times.)   Subjective  Prior history:  2018 outpatient consult for anorectal bleeding, heme positive stool, history of colorectal cancer in sister in their 33s, microcytic anemia with concern for iron deficiency and also history of thalassemia Colonoscopy normal-5-year recall recommended EGD normal except for incidental submucosal nodule second portion of duodenum just beyond reach of scope.  Discussed with advanced endoscopist and lesion would not be reachable by EUS  History of Present Illness   Kerry Lopez was here today to talk about scheduling her screening colonoscopy.  Her bowel habits have been regular without rectal bleeding.  She also tells me that primary care has been following her anemia and keeps her on regular iron therapy.  She denies nausea vomiting dysphagia, frequent heartburn or dysphagia.    ROS:  Review of Systems Arthralgias Denies chest pain or dyspnea Peripheral edema (on HCTZ) Remainder of systems negative except as above  Past Medical History: Past Medical History:  Diagnosis Date   Allergic rhinitis    Diabetes (HCC)    managing with diet   Gallstones    Hypertension    IDA (iron deficiency anemia)    IFG (impaired fasting glucose)    Obesity    Sleep apnea, obstructive    Thalassemia    S-alpha thalassemia     Past Surgical History: Past Surgical History:  Procedure Laterality Date   CHOLECYSTECTOMY  08/1997   COLONOSCOPY WITH PROPOFOL  N/A 11/30/2016   Procedure: COLONOSCOPY WITH PROPOFOL ;  Surgeon: Kerry Victory Kerry DOUGLAS, MD;  Location: [facility] ENDOSCOPY;  Service: Gastroenterology;  Laterality: N/A;   ESOPHAGOGASTRODUODENOSCOPY (EGD) WITH PROPOFOL  N/A 11/30/2016   Procedure:  ESOPHAGOGASTRODUODENOSCOPY (EGD) WITH PROPOFOL ;  Surgeon: Kerry Victory Kerry DOUGLAS, MD;  Location: [facility] ENDOSCOPY;  Service: Gastroenterology;  Laterality: N/A;     Family History: Family History  Problem Relation Age of Onset   Diabetes Mother    Hypertension Mother    Sickle cell trait Mother    Colon polyps Mother    Lung cancer Father        smoker   Hypertension Sister    Breast cancer Sister    Colon cancer Sister 54   Hypertension Sister    Hypertension Sister    Colon cancer Maternal Uncle    Colon cancer Other        mat great grandmother   Breast cancer Cousin        mat side    Social History: Social History   Socioeconomic History   Marital status: Single    Spouse name: Not on file   Number of children: 0   Years of education: Not on file   Highest education level: Not on file  Occupational History   Occupation: operations accounts lead  Tobacco Use   Smoking status: Never   Smokeless tobacco: Never  Vaping Use   Vaping status: Never Used  Substance and Sexual Activity   Alcohol use: No   Drug use: No   Sexual activity: Not on file  Other Topics Concern   Not on file  Social History Narrative   Not on file   Social Drivers of Health   Financial Resource Strain: Not on file  Food Insecurity: Not on file  Transportation Needs: Not on file  Physical  Activity: Not on file  Stress: Not on file  Social Connections: Not on file    Allergies: No Known Allergies  Outpatient Meds: Current Outpatient Medications  Medication Sig Dispense Refill   carvedilol (COREG) 12.5 MG tablet Take 12.5 mg by mouth 2 (two) times daily with a meal.     Cholecalciferol (VITAMIN D3) 125 MCG (5000 UT) CAPS Take 1 capsule by mouth 3 (three) times a week.     ferrous sulfate 325 (65 FE) MG tablet Take 325 mg by mouth daily with breakfast.     hydrochlorothiazide (HYDRODIURIL) 25 MG tablet Take 25 mg by mouth daily.     losartan (COZAAR) 100 MG tablet Take 100 mg by mouth  daily.     metFORMIN (GLUCOPHAGE) 500 MG tablet Take 500 mg by mouth daily with breakfast.     rosuvastatin (CRESTOR) 10 MG tablet Take 10 mg by mouth daily.     No current facility-administered medications for this visit.      ___________________________________________________________________ Objective   Exam:  BP 138/80 (BP Location: Left Arm, Patient Position: Sitting, Cuff Size: Large)   Pulse 72   Ht 5' 1.5 (1.562 m) Comment: Height measured without shoes  Wt (!) 367 lb 2 oz (166.5 kg)   BMI 68.24 kg/m  Wt Readings from Last 3 Encounters:  01/04/24 (!) 367 lb 2 oz (166.5 kg)  11/30/16 (!) 394 lb (178.7 kg)  10/26/16 (!) 394 lb (178.7 kg)    General: Well-appearing Eyes: sclera anicteric, no redness ENT: oral mucosa moist without lesions, no cervical or supraclavicular lymphadenopathy CV: Regular without appreciable murmur, no JVD,+ pretibial and ankle edema bilaterally with venous stasis changes Resp: clear to auscultation bilaterally, normal RR and effort noted GI: soft, no tenderness, with active bowel sounds.  Evaluation for mass or organomegaly limited by body habitus Skin; warm and dry, no rash or jaundice noted Neuro: awake, alert and oriented x 3. Normal gross motor function and fluent speech   Labs:  None recent available     Encounter Diagnoses  Name Primary?   Family history of colon cancer Yes   Duodenal nodule     Assessment and Plan Assessment & Plan   Due for screening colonoscopy due to family history.  She was agreeable after discussion of procedure and risks  Incidental submucosal duodenal nodule found on 2018 exam working up IDA.  Was not amenable to EUS due to location.  Should have endoscopic follow-up, so I recommended an upper endoscopy as well.  Agreeable after discussion of procedure and risks   The benefits and risks of the planned procedure(s) were described in detail with the patient or (when appropriate) their health care  proxy.  Risks were outlined as including, but not limited to, bleeding, infection, perforation, adverse medication reaction leading to cardiac or pulmonary decompensation, pancreatitis (if ERCP).  The limitation of incomplete mucosal visualization was also discussed.  No guarantees or warranties were given.  Patient at increased risk for cardiopulmonary complications of procedure due to medical comorbidities, particularly morbid obesity and obstructive sleep apnea. Must be done in hospital outpatient Endo lab   Thank you for the courtesy of this consult.  Please call me with any questions or concerns.  Victory Kerry Brand III  CC: Referring provider noted above

## 2024-02-18 ENCOUNTER — Encounter (HOSPITAL_COMMUNITY): Payer: Self-pay | Admitting: Gastroenterology

## 2024-02-21 ENCOUNTER — Telehealth: Payer: Self-pay | Admitting: Gastroenterology

## 2024-02-21 NOTE — Telephone Encounter (Signed)
Patient confirmed. No further questions.

## 2024-02-21 NOTE — Telephone Encounter (Addendum)
 Procedure:Endoscopy/Colonoscopy Procedure date: 02/28/24 Procedure location: [facility]: Arrival Time: 6:00 am Spoke with the patient Y/N:   No, I left a detailed message on [phone] on 02/21/24 @ 9:39 am for the patient to return call   Any prep concerns? ___  Has the patient obtained the prep from the pharmacy ? ___ Do you have a care partner and transportation: ___ Any additional concerns? ___

## 2024-02-26 NOTE — Anesthesia Preprocedure Evaluation (Signed)
 Anesthesia Evaluation  Patient identified by MRN, date of birth, ID band Patient awake    Reviewed: Allergy & Precautions, NPO status , Patient's Chart, lab work & pertinent test results, reviewed documented beta blocker date and time   Airway Mallampati: II  TM Distance: >3 FB Neck ROM: Full    Dental  (+) Dental Advisory Given, Missing   Pulmonary sleep apnea    Pulmonary exam normal breath sounds clear to auscultation       Cardiovascular hypertension, Pt. on home beta blockers and Pt. on medications Normal cardiovascular exam Rhythm:Regular Rate:Normal     Neuro/Psych negative neurological ROS     GI/Hepatic negative GI ROS, Neg liver ROS,,,  Endo/Other  diabetes, Type 2, Oral Hypoglycemic Agents  Class 3 obesity  Renal/GU negative Renal ROS     Musculoskeletal negative musculoskeletal ROS (+)    Abdominal   Peds  Hematology negative hematology ROS (+)   Anesthesia Other Findings   Reproductive/Obstetrics                              Anesthesia Physical Anesthesia Plan  ASA: 3  Anesthesia Plan: MAC   Post-op Pain Management: Minimal or no pain anticipated   Induction: Intravenous  PONV Risk Score and Plan: 2 and Treatment may vary due to age or medical condition and TIVA  Airway Management Planned: Natural Airway and Simple Face Mask  Additional Equipment:   Intra-op Plan:   Post-operative Plan:   Informed Consent: I have reviewed the patients History and Physical, chart, labs and discussed the procedure including the risks, benefits and alternatives for the proposed anesthesia with the patient or authorized representative who has indicated his/her understanding and acceptance.     Dental advisory given  Plan Discussed with: CRNA  Anesthesia Plan Comments:          Anesthesia Quick Evaluation

## 2024-02-28 ENCOUNTER — Encounter (HOSPITAL_COMMUNITY): Payer: Self-pay | Admitting: Gastroenterology

## 2024-02-28 ENCOUNTER — Encounter (HOSPITAL_COMMUNITY): Admitting: Anesthesiology

## 2024-02-28 ENCOUNTER — Other Ambulatory Visit: Payer: Self-pay

## 2024-02-28 ENCOUNTER — Encounter (HOSPITAL_COMMUNITY): Admission: RE | Disposition: A | Payer: Self-pay | Source: Home / Self Care | Attending: Gastroenterology

## 2024-02-28 ENCOUNTER — Ambulatory Visit (HOSPITAL_COMMUNITY)
Admission: RE | Admit: 2024-02-28 | Discharge: 2024-02-28 | Disposition: A | Attending: Gastroenterology | Admitting: Gastroenterology

## 2024-02-28 ENCOUNTER — Ambulatory Visit (HOSPITAL_COMMUNITY): Admitting: Anesthesiology

## 2024-02-28 DIAGNOSIS — Z800 Family history of malignant neoplasm of digestive organs: Secondary | ICD-10-CM

## 2024-02-28 DIAGNOSIS — D125 Benign neoplasm of sigmoid colon: Secondary | ICD-10-CM

## 2024-02-28 DIAGNOSIS — K6389 Other specified diseases of intestine: Secondary | ICD-10-CM

## 2024-02-28 DIAGNOSIS — Z1211 Encounter for screening for malignant neoplasm of colon: Secondary | ICD-10-CM | POA: Diagnosis not present

## 2024-02-28 DIAGNOSIS — K3189 Other diseases of stomach and duodenum: Secondary | ICD-10-CM

## 2024-02-28 HISTORY — PX: COLONOSCOPY: SHX5424

## 2024-02-28 HISTORY — PX: ESOPHAGOGASTRODUODENOSCOPY: SHX5428

## 2024-02-28 LAB — GLUCOSE, CAPILLARY: Glucose-Capillary: 133 mg/dL — ABNORMAL HIGH (ref 70–99)

## 2024-02-28 SURGERY — EGD (ESOPHAGOGASTRODUODENOSCOPY)
Anesthesia: Monitor Anesthesia Care

## 2024-02-28 MED ORDER — PROPOFOL 500 MG/50ML IV EMUL
INTRAVENOUS | Status: AC
  Filled 2024-02-28: qty 50

## 2024-02-28 MED ORDER — SODIUM CHLORIDE 0.9 % IV SOLN
INTRAVENOUS | Status: DC
  Administered 2024-02-28: 500 mL via INTRAVENOUS

## 2024-02-28 MED ORDER — PHENYLEPHRINE 80 MCG/ML (10ML) SYRINGE FOR IV PUSH (FOR BLOOD PRESSURE SUPPORT)
PREFILLED_SYRINGE | INTRAVENOUS | Status: DC | PRN
  Administered 2024-02-28: 160 ug via INTRAVENOUS
  Administered 2024-02-28: 80 ug via INTRAVENOUS

## 2024-02-28 MED ORDER — PROPOFOL 10 MG/ML IV BOLUS
INTRAVENOUS | Status: DC | PRN
  Administered 2024-02-28: 20 mg via INTRAVENOUS
  Administered 2024-02-28: 30 mg via INTRAVENOUS
  Administered 2024-02-28 (×2): 20 mg via INTRAVENOUS

## 2024-02-28 MED ORDER — PROPOFOL 500 MG/50ML IV EMUL
INTRAVENOUS | Status: DC | PRN
  Administered 2024-02-28: 140 ug/kg/min via INTRAVENOUS

## 2024-02-28 MED ORDER — PROPOFOL 1000 MG/100ML IV EMUL
INTRAVENOUS | Status: AC
  Filled 2024-02-28: qty 100

## 2024-02-28 NOTE — Transfer of Care (Signed)
 Immediate Anesthesia Transfer of Care Note  Patient: Kerry Lopez  Procedure(s) Performed: EGD (ESOPHAGOGASTRODUODENOSCOPY) COLONOSCOPY  Patient Location: PACU  Anesthesia Type:MAC  Level of Consciousness: drowsy  Airway & Oxygen Therapy: Patient Spontanous Breathing and Patient connected to face mask oxygen  Post-op Assessment: Report given to RN, Post -op Vital signs reviewed and stable, and Patient moving all extremities X 4  Post vital signs: Reviewed and stable  Last Vitals:  Vitals Value Taken Time  BP 93/47   Temp    Pulse 66 02/28/24 08:26  Resp 33 02/28/24 08:26  SpO2 93 % 02/28/24 08:26  Vitals shown include unfiled device data.  Last Pain:  Vitals:   02/28/24 0627  TempSrc: Temporal  PainSc: 0-No pain         Complications: No notable events documented.

## 2024-02-28 NOTE — H&P (Signed)
 History and Physical:  This patient presents for endoscopic testing for: Family history of colon cancer Duodenal nodule  This is a 58 year old woman here today for screening colonoscopy with a family history of colon cancer.  No polyps on last colonoscopy in 2018. At that time she also had an upper endoscopy incidentally discovering a benign-appearing duodenal nodule just beyond reach of endoscope.  She is having a screening colonoscopy today and a diagnostic upper endoscopy to assess the duodenal nodule  Further clinical details of her history are outlined in my office note of 01/04/2024, with no significant clinical changes since then.  Patient is otherwise without complaints or active issues today.   Past Medical History: Past Medical History:  Diagnosis Date   Allergic rhinitis    Diabetes (HCC)    managing with diet   Gallstones    Hypertension    IDA (iron deficiency anemia)    IFG (impaired fasting glucose)    Obesity    Sleep apnea, obstructive    Thalassemia    S-alpha thalassemia     Past Surgical History: Past Surgical History:  Procedure Laterality Date   CHOLECYSTECTOMY  08/1997   COLONOSCOPY WITH PROPOFOL  N/A 11/30/2016   Procedure: COLONOSCOPY WITH PROPOFOL ;  Surgeon: Legrand Victory LITTIE DOUGLAS, MD;  Location: [facility];  Service: Gastroenterology;  Laterality: N/A;   ESOPHAGOGASTRODUODENOSCOPY (EGD) WITH PROPOFOL  N/A 11/30/2016   Procedure: ESOPHAGOGASTRODUODENOSCOPY (EGD) WITH PROPOFOL ;  Surgeon: Legrand Victory LITTIE DOUGLAS, MD;  Location: [facility];  Service: Gastroenterology;  Laterality: N/A;    Allergies: No Known Allergies  Outpatient Meds: Current Facility-Administered Medications  Medication Dose Route Frequency Provider Last Rate Last Admin   0.9 %  sodium chloride  infusion   Intravenous Continuous Legrand Victory LITTIE III, MD 20 mL/hr at 02/28/24 0647 500 mL at 02/28/24 0647      ___________________________________________________________________ Objective    Exam:  BP (!) 162/59   Pulse 75   Temp 97.9 F (36.6 C) (Temporal)   Resp (!) 25   Ht 5' 1.5 (1.562 m)   Wt (!) 166.5 kg   SpO2 93%   BMI 68.22 kg/m   CV: regular , S1/S2 Resp: clear to auscultation bilaterally, normal RR and effort noted GI: soft, no tenderness, with active bowel sounds.   Assessment: Duodenal nodule Family history colorectal cancer  Plan: Colonoscopy EGD  The benefits and risks of the planned procedure(s) were described in detail with the patient or (when appropriate) their health care proxy.  Risks were outlined as including, but not limited to, bleeding, infection, perforation, adverse medication reaction leading to cardiac or pulmonary decompensation, pancreatitis (if ERCP).  The limitation of incomplete mucosal visualization was also discussed.  No guarantees or warranties were given.  The patient was provided an opportunity to ask questions and all were answered. The patient agreed with the plan.   The patient is appropriate for an endoscopic procedure in the ambulatory setting.   - Victory Legrand, MD

## 2024-02-28 NOTE — Op Note (Signed)
 [facility] Patient Name: Kerry Lopez Procedure Date: 02/28/2024 MRN: [id number] Attending MD: Kerry Lopez , MD, [id number] Date of Birth: [birth year] CSN: [id number] Age: 59 Admit Type: Outpatient Procedure:                Upper GI endoscopy Indications:              Follow-up of polyps in the duodenum (seen on 2018                            EGD) Providers:                Kerry L. Legrand, MD, Collene Edu, RN, Curtistine Bishop, Technician Referring MD:              Medicines:                Monitored Anesthesia Care Complications:            No immediate complications. Estimated Blood Loss:     Estimated blood loss was minimal. Procedure:                Pre-Anesthesia Assessment:                           - Prior to the procedure, a History and Physical                            was performed, and patient medications and                            allergies were reviewed. The patient's tolerance of                            previous anesthesia was also reviewed. The risks                            and benefits of the procedure and the sedation                            options and risks were discussed with the patient.                            All questions were answered, and informed consent                            was obtained. Prior Anticoagulants: The patient has                            taken no anticoagulant or antiplatelet agents. ASA                            Grade Assessment: IV - A patient with severe  systemic disease that is a constant threat to life.                            After reviewing the risks and benefits, the patient                            was deemed in satisfactory condition to undergo the                            procedure.                           After obtaining informed consent, the endoscope was                            passed under direct vision. Throughout the                             procedure, the patient's blood pressure, pulse, and                            oxygen saturations were monitored continuously. The                            [id number]-[id number] ([id number]) Olympus endoscope was introduced                            through the mouth, and advanced to the second part                            of duodenum. This scope was removed and changed for                            a pediatric colonoscope, which was advanced to the                            4th portion of the duodenum in order to evaluate                            the polyp in the 3rd portion. The upper GI                            endoscopy was accomplished without difficulty. The                            patient tolerated the procedure well. Scope In: Scope Out: Findings:      The esophagus was normal. (somewhat patuoous EGJ)      The stomach was normal.      The cardia and gastric fundus were normal on retroflexion.      A single 12-15 mm semi-pedunculated soft, mobile polyp with no bleeding       was found in the third portion of the duodenum. bite on bite biopsies       were  taken with a cold forceps for histology. After ward, glistening       submucosal fat was revealed.      The exam of the duodenum was otherwise normal. Impression:               - Normal esophagus.                           - Normal stomach.                           - A single duodenal polyp. 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 Moderate Sedation:      MAC sedation used Recommendation:           - Patient has a contact number available for                            emergencies. The signs and symptoms of potential                            delayed complications were discussed with the                            patient. Return to normal activities tomorrow.                            Written discharge instructions were provided to the                             patient.                           - Resume previous diet.                           - Continue present medications.                           - Await pathology results. No endoscopic                            surveillance of the lesion is needed.                           - See the other procedure note for documentation of                            additional recommendations. Procedure Code(s):        --- Professional ---                           [phone], Esophagogastroduodenoscopy, flexible,                            transoral; with biopsy, single or  multiple Diagnosis Code(s):        --- Professional ---                           K31.7, Polyp of stomach and duodenum CPT copyright 2022 [employer]. All rights reserved. The codes documented in this report are preliminary and upon coder review may  be revised to meet current compliance requirements. Raheel Kunkle L. Legrand, MD 02/28/2024 8:28:26 AM This report has been signed electronically. Number of Addenda: 0

## 2024-02-28 NOTE — Discharge Instructions (Signed)

## 2024-02-28 NOTE — Interval H&P Note (Signed)
 History and Physical Interval Note:  02/28/2024 7:32 AM  Kerry Lopez  has presented today for surgery, with the diagnosis of Family history of colon cancer [Z80.0], Duodenal nodule [K31.89].  The various methods of treatment have been discussed with the patient and family. After consideration of risks, benefits and other options for treatment, the patient has consented to  Procedure(s): EGD (ESOPHAGOGASTRODUODENOSCOPY) (N/A) COLONOSCOPY (N/A) as a surgical intervention.  The patient's history has been reviewed, patient examined, no change in status, stable for surgery.  I have reviewed the patient's chart and labs.  Questions were answered to the patient's satisfaction.     Victory LITTIE Brand III

## 2024-02-28 NOTE — Op Note (Signed)
 [facility] Patient Name: Kerry Lopez Procedure Date: 02/28/2024 MRN: [id number] Attending MD: Victory CROME. Legrand , MD, [id number] Date of Birth: [birth year] CSN: [id number] Age: 58 Admit Type: Outpatient Procedure:                Colonoscopy Indications:              Screening in patient at increased risk: Colorectal                            cancer in sister before age 101                           no polyps last colonoscopy in 2018 Providers:                Victory L. Legrand, MD, Collene Edu, RN, Curtistine Bishop, Technician Referring MD:              Medicines:                Monitored Anesthesia Care Complications:            No immediate complications. Estimated Blood Loss:     Estimated blood loss: none. Procedure:                Pre-Anesthesia Assessment:                           - Prior to the procedure, a History and Physical                            was performed, and patient medications and                            allergies were reviewed. The patient's tolerance of                            previous anesthesia was also reviewed. The risks                            and benefits of the procedure and the sedation                            options and risks were discussed with the patient.                            All questions were answered, and informed consent                            was obtained. Prior Anticoagulants: The patient has                            taken no anticoagulant or antiplatelet agents. ASA  Grade Assessment: IV - A patient with severe                            systemic disease that is a constant threat to life.                            After reviewing the risks and benefits, the patient                            was deemed in satisfactory condition to undergo the                            procedure.                           After obtaining informed consent, the  colonoscope                            was passed under direct vision. Throughout the                            procedure, the patient's blood pressure, pulse, and                            oxygen saturations were monitored continuously. The                            [id number]-[id number] ([id number]) Olympus colonoscope was                            introduced through the anus and advanced to the the                            cecum, identified by appendiceal orifice and                            ileocecal valve. The colonoscopy was somewhat                            difficult due to a redundant colon. Successful                            completion of the procedure was aided by using                            manual pressure and straightening and shortening                            the scope to obtain bowel loop reduction. The                            patient tolerated the procedure well. The quality  of the bowel preparation was excellent. The                            ileocecal valve, appendiceal orifice, and rectum                            were photographed. Scope In: 8:04:47 AM Scope Out: 8:18:55 AM Scope Withdrawal Time: 0 hours 8 minutes 33 seconds  Total Procedure Duration: 0 hours 14 minutes 8 seconds  Findings:      The perianal and digital rectal examinations were normal.      Repeat examination of right colon under NBI performed.      A 6-8 mm polyp was found in the proximal sigmoid colon. The polyp was       pedunculated. The polyp was removed with a hot snare. Resection and       retrieval were complete.      The exam was otherwise without abnormality on direct and retroflexion       views. Impression:               - One 6-8 mm polyp in the proximal sigmoid colon,                            removed with a hot snare. Resected and retrieved.                           - The examination was otherwise normal on direct                            and  retroflexion views. Moderate Sedation:      MAC sedation used Recommendation:           - Patient has a contact number available for                            emergencies. The signs and symptoms of potential                            delayed complications were discussed with the                            patient. Return to normal activities tomorrow.                            Written discharge instructions were provided to the                            patient.                           - Resume previous diet.                           - Continue present medications.                           - Await pathology results.                           -  Repeat colonoscopy is recommended for                            surveillance. The colonoscopy date will be                            determined after pathology results from today's                            exam become available for review. (Not longer than                            5 years due to family Hx)                           - See the other procedure note for documentation of                            additional recommendations. Procedure Code(s):        --- Professional ---                           [phone], Colonoscopy, flexible; with removal of                            tumor(s), polyp(s), or other lesion(s) by snare                            technique Diagnosis Code(s):        --- Professional ---                           Z80.0, Family history of malignant neoplasm of                            digestive organs                           D12.5, Benign neoplasm of sigmoid colon CPT copyright 2022 American [employer] Association. All rights reserved. The codes documented in this report are preliminary and upon coder review may  be revised to meet current compliance requirements. [name], MD 02/28/2024 8:33:22 AM This report has been signed electronically. Number of Addenda: 0

## 2024-02-28 NOTE — Anesthesia Procedure Notes (Signed)
 Procedure Name: MAC Date/Time: 02/28/2024 7:39 AM  Performed by: Brandy Almarie BROCKS, CRNAPre-anesthesia Checklist: Patient identified, Emergency Drugs available, Suction available and Patient being monitored Oxygen Delivery Method: Simple face mask

## 2024-02-29 ENCOUNTER — Ambulatory Visit: Payer: Self-pay | Admitting: Gastroenterology

## 2024-02-29 ENCOUNTER — Encounter (HOSPITAL_COMMUNITY): Payer: Self-pay | Admitting: Gastroenterology

## 2024-02-29 LAB — SURGICAL PATHOLOGY

## 2024-02-29 NOTE — Telephone Encounter (Signed)
 Letter mailed and recall placed.

## 2024-02-29 NOTE — Anesthesia Postprocedure Evaluation (Signed)
 Anesthesia Post Note  Patient: Kerry Lopez  Procedure(s) Performed: EGD (ESOPHAGOGASTRODUODENOSCOPY) COLONOSCOPY     Patient location during evaluation: Endoscopy Anesthesia Type: MAC Level of consciousness: oriented, awake and alert and awake Pain management: pain level controlled Vital Signs Assessment: post-procedure vital signs reviewed and stable Respiratory status: spontaneous breathing, nonlabored ventilation and respiratory function stable Cardiovascular status: blood pressure returned to baseline and stable Postop Assessment: no headache, no backache and no apparent nausea or vomiting Anesthetic complications: no   No notable events documented.  Last Vitals:  Vitals:   02/28/24 0850 02/28/24 0858  BP: 120/77 118/64  Pulse: 70 64  Resp: (!) 24 16  Temp:    SpO2: 96% 98%    Last Pain:  Vitals:   02/28/24 0858  TempSrc:   PainSc: 0-No pain                 Garnette FORBES Skillern
